# Patient Record
Sex: Female | Born: 1990 | Race: White | Hispanic: No | Marital: Single | Smoking: Never smoker
Health system: Southern US, Community
[De-identification: ages and names within clinical notes are randomized; demographics above are authoritative.]

## PROBLEM LIST (undated history)

## (undated) DIAGNOSIS — N200 Calculus of kidney: Secondary | ICD-10-CM

## (undated) HISTORY — PX: NECK SURGERY: SHX720

---

## 2011-03-25 ENCOUNTER — Emergency Department (HOSPITAL_COMMUNITY)
Admission: EM | Admit: 2011-03-25 | Discharge: 2011-03-25 | Disposition: A | Discharge status: Home / Self Care | Payer: PRIVATE HEALTH INSURANCE | Attending: Emergency Medicine | Admitting: Emergency Medicine

## 2011-03-25 ENCOUNTER — Encounter (HOSPITAL_COMMUNITY): Payer: Self-pay | Admitting: Radiology

## 2011-03-25 ENCOUNTER — Emergency Department (HOSPITAL_COMMUNITY): Payer: PRIVATE HEALTH INSURANCE

## 2011-03-25 DIAGNOSIS — N201 Calculus of ureter: Secondary | ICD-10-CM | POA: Insufficient documentation

## 2011-03-25 LAB — URINE MICROSCOPIC-ADD ON

## 2011-03-25 LAB — CBC
MCV: 89.3 fL (ref 78.0–100.0)
Platelets: 159 10*3/uL (ref 150–400)
RBC: 4.11 MIL/uL (ref 3.87–5.11)
WBC: 12.9 10*3/uL — ABNORMAL HIGH (ref 4.0–10.5)

## 2011-03-25 LAB — DIFFERENTIAL
Basophils Relative: 0 % (ref 0–1)
Eosinophils Absolute: 0.1 10*3/uL (ref 0.0–0.7)
Eosinophils Relative: 1 % (ref 0–5)
Lymphocytes Relative: 11 % — ABNORMAL LOW (ref 12–46)
Monocytes Relative: 3 % (ref 3–12)
Neutrophils Relative %: 85 % — ABNORMAL HIGH (ref 43–77)

## 2011-03-25 LAB — POCT I-STAT, CHEM 8
BUN: 14 mg/dL (ref 6–23)
Chloride: 107 mEq/L (ref 96–112)
Creatinine, Ser: 0.8 mg/dL (ref 0.4–1.2)
Hemoglobin: 13.3 g/dL (ref 12.0–15.0)
Potassium: 3.6 mEq/L (ref 3.5–5.1)
Sodium: 141 mEq/L (ref 135–145)

## 2011-03-25 LAB — URINALYSIS, ROUTINE W REFLEX MICROSCOPIC
Bilirubin Urine: NEGATIVE
Leukocytes, UA: NEGATIVE
Nitrite: NEGATIVE
Specific Gravity, Urine: 1.018 (ref 1.005–1.030)
pH: 8.5 — ABNORMAL HIGH (ref 5.0–8.0)

## 2011-03-25 MED ORDER — IOHEXOL 300 MG/ML  SOLN
100.0000 mL | Freq: Once | INTRAMUSCULAR | Status: AC | PRN
  Administered 2011-03-25: 80 mL via INTRAVENOUS

## 2013-04-17 ENCOUNTER — Emergency Department (HOSPITAL_COMMUNITY): Payer: BC Managed Care – PPO

## 2013-04-17 ENCOUNTER — Encounter (HOSPITAL_COMMUNITY): Payer: Self-pay

## 2013-04-17 ENCOUNTER — Emergency Department (HOSPITAL_COMMUNITY)
Admission: EM | Admit: 2013-04-17 | Discharge: 2013-04-17 | Disposition: A | Discharge status: Home / Self Care | Payer: BC Managed Care – PPO | Attending: Emergency Medicine | Admitting: Emergency Medicine

## 2013-04-17 DIAGNOSIS — Z79899 Other long term (current) drug therapy: Secondary | ICD-10-CM | POA: Insufficient documentation

## 2013-04-17 DIAGNOSIS — N83201 Unspecified ovarian cyst, right side: Secondary | ICD-10-CM

## 2013-04-17 DIAGNOSIS — R109 Unspecified abdominal pain: Secondary | ICD-10-CM | POA: Insufficient documentation

## 2013-04-17 DIAGNOSIS — Z3202 Encounter for pregnancy test, result negative: Secondary | ICD-10-CM | POA: Insufficient documentation

## 2013-04-17 DIAGNOSIS — N83209 Unspecified ovarian cyst, unspecified side: Secondary | ICD-10-CM | POA: Insufficient documentation

## 2013-04-17 DIAGNOSIS — N200 Calculus of kidney: Secondary | ICD-10-CM | POA: Insufficient documentation

## 2013-04-17 LAB — URINALYSIS, ROUTINE W REFLEX MICROSCOPIC
Bilirubin Urine: NEGATIVE
Ketones, ur: NEGATIVE mg/dL
Protein, ur: NEGATIVE mg/dL
Urobilinogen, UA: 1 mg/dL (ref 0.0–1.0)

## 2013-04-17 LAB — URINE MICROSCOPIC-ADD ON

## 2013-04-17 MED ORDER — OXYCODONE-ACETAMINOPHEN 5-325 MG PO TABS
1.0000 | ORAL_TABLET | ORAL | Status: DC | PRN
Start: 2013-04-17 — End: 2014-08-11

## 2013-04-17 MED ORDER — TAMSULOSIN HCL 0.4 MG PO CAPS: 0.4000 mg | ORAL_CAPSULE | Freq: Two times a day (BID) | ORAL | Status: DC

## 2013-04-17 MED ORDER — PROMETHAZINE HCL 25 MG PO TABS: 25.0000 mg | ORAL_TABLET | Freq: Four times a day (QID) | ORAL | Status: DC | PRN

## 2013-04-17 MED ORDER — NAPROXEN 500 MG PO TABS: 500.0000 mg | ORAL_TABLET | Freq: Two times a day (BID) | ORAL | Status: DC

## 2013-04-17 MED ORDER — NAPROXEN 250 MG PO TABS
500.0000 mg | ORAL_TABLET | Freq: Once | ORAL | Status: AC
  Administered 2013-04-17: 500 mg via ORAL
  Filled 2013-04-17: qty 2

## 2013-04-17 NOTE — ED Notes (Signed)
Pt states she has been having right side pain for the past few days and then this morning started having urinary urgency and frequency.

## 2013-04-17 NOTE — ED Provider Notes (Signed)
History    CSN: [id number] Arrival date & time 04/17/13  0445  First MD Initiated Contact with Patient 04/17/13 [phone]     Chief Complaint  Patient presents with  . Urinary Frequency   (Consider location/radiation/quality/duration/timing/severity/associated sxs/prior Treatment) HPI Comments: Pt is an otherwise healthy 22 y/o female who states that she has had a KS in the past - presents with complaint of lower abd pain that has been present for several days intermittently radiating to the R flank.  She has no fevers but had become diaphoretic earlier in the day with the pain.  This last 12 hours she has noticed that she has had difficulty with urinating - she has the urge but is unable to pass much urine on attempt.  Sx are intermittent, gradually worsening.  No n/v.  The history is provided by the patient and a relative.   History reviewed. No pertinent past medical history. History reviewed. No pertinent past surgical history. No family history on file. History  Substance Use Topics  . Smoking status: Never Smoker   . Smokeless tobacco: Not on file  . Alcohol Use: No   OB History   Grav Para Term Preterm Abortions TAB SAB Ect Mult Living                 Review of Systems  All other systems reviewed and are negative.    Allergies  Peanut-containing drug products  Home Medications   Current Outpatient Rx  Name  Route  Sig  Dispense  Refill  . loratadine (CLARITIN) 10 MG tablet   Oral   Take 10 mg by mouth daily.         . naproxen (NAPROSYN) 500 MG tablet   Oral   Take 1 tablet (500 mg total) by mouth 2 (two) times daily with a meal.   30 tablet   0   . oxyCODONE-acetaminophen (PERCOCET) 5-325 MG per tablet   Oral   Take 1 tablet by mouth every 4 (four) hours as needed for pain.   20 tablet   0   . promethazine (PHENERGAN) 25 MG tablet   Oral   Take 1 tablet (25 mg total) by mouth every 6 (six) hours as needed for nausea.   12 tablet   0   . tamsulosin  (FLOMAX) 0.4 MG CAPS   Oral   Take 1 capsule (0.4 mg total) by mouth 2 (two) times daily.   10 capsule   0    BP 115/54  Pulse 86  Temp(Src) 97.9 F (36.6 C) (Oral)  Resp 19  SpO2 99%  LMP 04/05/2013 Physical Exam  Nursing note and vitals reviewed. Constitutional: She appears well-developed and well-nourished. No distress.  HENT:  Head: Normocephalic and atraumatic.  Mouth/Throat: Oropharynx is clear and moist. No oropharyngeal exudate.  Eyes: Conjunctivae and EOM are normal. Pupils are equal, round, and reactive to light. Right eye exhibits no discharge. Left eye exhibits no discharge. No scleral icterus.  Neck: Normal range of motion. Neck supple. No JVD present. No thyromegaly present.  Cardiovascular: Normal rate, regular rhythm, normal heart sounds and intact distal pulses.  Exam reveals no gallop and no friction rub.   No murmur heard. Pulmonary/Chest: Effort normal and breath sounds normal. No respiratory distress. She has no wheezes. She has no rales.  Abdominal: Soft. Bowel sounds are normal. She exhibits no distension and no mass. There is no tenderness.  No abd ttp and no CVA ttp  Musculoskeletal: Normal range  of motion. She exhibits no edema and no tenderness.  Lymphadenopathy:    She has no cervical adenopathy.  Neurological: She is alert. Coordination normal.  Skin: Skin is warm and dry. No rash noted. No erythema.  Psychiatric: She has a normal mood and affect. Her behavior is normal.    ED Course  Procedures (including critical care time) Labs Reviewed  URINALYSIS, ROUTINE W REFLEX MICROSCOPIC - Abnormal; Notable for the following:    APPearance CLOUDY (*)    Hgb urine dipstick LARGE (*)    Leukocytes, UA SMALL (*)    All other components within normal limits  URINE MICROSCOPIC-ADD ON - Abnormal; Notable for the following:    Bacteria, UA FEW (*)    All other components within normal limits  URINE CULTURE  POCT PREGNANCY, URINE   Ct Abdomen Pelvis Wo  Contrast  04/17/2013   *RADIOLOGY REPORT*  Clinical Data: Right-sided abdominal pain, urinary urgency/frequency  CT ABDOMEN AND PELVIS WITHOUT CONTRAST  Technique:  Multidetector CT imaging of the abdomen and pelvis was performed following the standard protocol without intravenous contrast.  Comparison: 03/25/2011  Findings: Lung bases are clear.  Unenhanced liver, spleen, pancreas, and adrenal glands within normal limits.  Gallbladder is unremarkable.  No intrahepatic or extrahepatic ductal dilatation.  Punctate nonobstructing left upper pole renal calculus (series 2/image 18).  3 mm nonobstructing left lower pole renal calculus (series 2/image 24).  Right kidney is within normal limits.  No hydronephrosis.  No evidence of bowel obstruction.  Normal appendix.  No evidence of abdominal aortic aneurysm.  No abdominal ascites.  No suspicious abdominopelvic lymphadenopathy.  Uterus left ovary are unremarkable.  5.5 x 4.1 cm right ovarian cyst / follicle, likely physiologic.  Suspected 3 mm distal right ureteral calculus (series 2/image 66). Bladder is within normal limits.  Visualized osseous structures are within normal limits.  IMPRESSION: Suspected 3 mm distal right ureteral calculus.  No hydronephrosis.  Two nonobstructing left renal calculi measuring up to 3 mm.  5.5 cm right ovarian cyst / follicle, likely physiologic.  If clinically warranted, consider follow-up pelvic ultrasound in 6-12 weeks.   Original Report Authenticated By: Charline Bills, M.D.   1. Kidney stone on right side   2. Ovarian cyst, right     MDM  The pt has no abd ttp, soft abd, normal VS and no CVA ttp.  Check ua and preg.  Pt appears very comfortable on initial exam.  Naprosyn ordered.  Pt states that she feels stable - no acute pain at this time - CT confirms 3mm stone as well as ovarian cyst - pt informed - meds Rx for home, Uro f/u.  Meds given in ED:  Medications  naproxen (NAPROSYN) tablet 500 mg (500 mg Oral Given  04/17/13 0537)    New Prescriptions   NAPROXEN (NAPROSYN) 500 MG TABLET    Take 1 tablet (500 mg total) by mouth 2 (two) times daily with a meal.   OXYCODONE-ACETAMINOPHEN (PERCOCET) 5-325 MG PER TABLET    Take 1 tablet by mouth every 4 (four) hours as needed for pain.   PROMETHAZINE (PHENERGAN) 25 MG TABLET    Take 1 tablet (25 mg total) by mouth every 6 (six) hours as needed for nausea.   TAMSULOSIN (FLOMAX) 0.4 MG CAPS    Take 1 capsule (0.4 mg total) by mouth 2 (two) times daily.      Vida Roller, MD 04/17/13 [phone]

## 2013-04-18 ENCOUNTER — Encounter (HOSPITAL_COMMUNITY): Payer: Self-pay | Admitting: *Deleted

## 2013-04-18 ENCOUNTER — Emergency Department (HOSPITAL_COMMUNITY)
Admission: EM | Admit: 2013-04-18 | Discharge: 2013-04-18 | Disposition: A | Discharge status: Home / Self Care | Payer: BC Managed Care – PPO | Attending: Emergency Medicine | Admitting: Emergency Medicine

## 2013-04-18 DIAGNOSIS — N200 Calculus of kidney: Secondary | ICD-10-CM | POA: Insufficient documentation

## 2013-04-18 DIAGNOSIS — Z79899 Other long term (current) drug therapy: Secondary | ICD-10-CM | POA: Insufficient documentation

## 2013-04-18 DIAGNOSIS — R112 Nausea with vomiting, unspecified: Secondary | ICD-10-CM | POA: Insufficient documentation

## 2013-04-18 DIAGNOSIS — R1031 Right lower quadrant pain: Secondary | ICD-10-CM | POA: Insufficient documentation

## 2013-04-18 LAB — POCT I-STAT, CHEM 8
BUN: 14 mg/dL (ref 6–23)
Calcium, Ion: 1.12 mmol/L (ref 1.12–1.23)
Creatinine, Ser: 0.8 mg/dL (ref 0.50–1.10)
TCO2: 21 mmol/L (ref 0–100)

## 2013-04-18 MED ORDER — PROMETHAZINE HCL 25 MG RE SUPP: 25.0000 mg | Freq: Four times a day (QID) | RECTAL | Status: DC | PRN

## 2013-04-18 MED ORDER — ONDANSETRON 4 MG PO TBDP: 4.0000 mg | ORAL_TABLET | Freq: Three times a day (TID) | ORAL | Status: DC | PRN

## 2013-04-18 MED ORDER — ONDANSETRON HCL 4 MG/2ML IJ SOLN
4.0000 mg | Freq: Once | INTRAMUSCULAR | Status: AC
  Administered 2013-04-18: 4 mg via INTRAVENOUS
  Filled 2013-04-18: qty 2

## 2013-04-18 MED ORDER — ONDANSETRON HCL 4 MG/2ML IJ SOLN: 4.0000 mg | Freq: Once | INTRAMUSCULAR | Status: DC

## 2013-04-18 MED ORDER — HYDROMORPHONE HCL PF 1 MG/ML IJ SOLN
1.0000 mg | Freq: Once | INTRAMUSCULAR | Status: AC
  Administered 2013-04-18: 1 mg via INTRAVENOUS
  Filled 2013-04-18: qty 1

## 2013-04-18 MED ORDER — KETOROLAC TROMETHAMINE 30 MG/ML IJ SOLN
30.0000 mg | Freq: Once | INTRAMUSCULAR | Status: AC
  Administered 2013-04-18: 30 mg via INTRAVENOUS
  Filled 2013-04-18: qty 1

## 2013-04-18 NOTE — ED Provider Notes (Signed)
History    CSN: [id number] Arrival date & time 04/18/13  4098  First MD Initiated Contact with Patient 04/18/13 0246     Chief Complaint  Patient presents with  . Emesis  . Flank Pain   (Consider location/radiation/quality/duration/timing/severity/associated sxs/prior Treatment) HPI Comments: 22 year old female presents with second visit for kidney stone pain and nausea and vomiting in the last 24 hours. The patient had had a mild amount of pain yesterday, had a CT scan showing a distal ureteral kidney stone as well as an ovarian cyst, she was informed of these results, given prescription medications including Flomax, antiemetics and pain medications but throughout the course of the day have gradually developed worsening symptoms this evening. She has been nauseated, vomiting, unable to tolerate oral medications prior to arrival. The pain is in the right lower quadrant, radiates to the flank and is sharp and stabbing, intermittent and nothing seems to make it better or worse.  Patient is a 22 y.o. female presenting with vomiting and flank pain. The history is provided by the patient, a relative and medical records.  Emesis Flank Pain   History reviewed. No pertinent past medical history. History reviewed. No pertinent past surgical history. No family history on file. History  Substance Use Topics  . Smoking status: Never Smoker   . Smokeless tobacco: Not on file  . Alcohol Use: No   OB History   Grav Para Term Preterm Abortions TAB SAB Ect Mult Living                 Review of Systems  Gastrointestinal: Positive for vomiting.  Genitourinary: Positive for flank pain.  All other systems reviewed and are negative.    Allergies  Peanut-containing drug products  Home Medications   Current Outpatient Rx  Name  Route  Sig  Dispense  Refill  . loratadine (CLARITIN) 10 MG tablet   Oral   Take 10 mg by mouth daily.         . naproxen (NAPROSYN) 500 MG tablet   Oral    Take 1 tablet (500 mg total) by mouth 2 (two) times daily with a meal.   30 tablet   0   . oxyCODONE-acetaminophen (PERCOCET) 5-325 MG per tablet   Oral   Take 1 tablet by mouth every 4 (four) hours as needed for pain.   20 tablet   0   . promethazine (PHENERGAN) 25 MG tablet   Oral   Take 1 tablet (25 mg total) by mouth every 6 (six) hours as needed for nausea.   12 tablet   0   . tamsulosin (FLOMAX) 0.4 MG CAPS   Oral   Take 1 capsule (0.4 mg total) by mouth 2 (two) times daily.   10 capsule   0   . ondansetron (ZOFRAN ODT) 4 MG disintegrating tablet   Oral   Take 1 tablet (4 mg total) by mouth every 8 (eight) hours as needed for nausea.   10 tablet   0   . promethazine (PHENERGAN) 25 MG suppository   Rectal   Place 1 suppository (25 mg total) rectally every 6 (six) hours as needed for nausea.   12 each   0    BP 124/66  Temp(Src) 98.2 F (36.8 C) (Oral)  Resp 18  SpO2 98%  LMP 04/05/2013 Physical Exam  Nursing note and vitals reviewed. Constitutional: She appears well-developed and well-nourished.  Uncomfortable appearing, colicky  HENT:  Head: Normocephalic and atraumatic.  Mouth/Throat: Oropharynx  is clear and moist. No oropharyngeal exudate.  Eyes: Conjunctivae and EOM are normal. Pupils are equal, round, and reactive to light. Right eye exhibits no discharge. Left eye exhibits no discharge. No scleral icterus.  Neck: Normal range of motion. Neck supple. No JVD present. No thyromegaly present.  Cardiovascular: Normal rate, regular rhythm, normal heart sounds and intact distal pulses.  Exam reveals no gallop and no friction rub.   No murmur heard. Pulmonary/Chest: Effort normal and breath sounds normal. No respiratory distress. She has no wheezes. She has no rales.  Abdominal: Soft. Bowel sounds are normal. She exhibits no distension and no mass. There is no tenderness.  The patient has no reproducible abdominal tenderness to palpation  Musculoskeletal:  Normal range of motion. She exhibits no edema and no tenderness.  Lymphadenopathy:    She has no cervical adenopathy.  Neurological: She is alert. Coordination normal.  Skin: Skin is warm and dry. No rash noted. No erythema.  Psychiatric: She has a normal mood and affect. Her behavior is normal.    ED Course  Procedures (including critical care time) Labs Reviewed  POCT I-STAT, CHEM 8 - Abnormal; Notable for the following:    Glucose, Bld 131 (*)    All other components within normal limits   Ct Abdomen Pelvis Wo Contrast  04/17/2013   *RADIOLOGY REPORT*  Clinical Data: Right-sided abdominal pain, urinary urgency/frequency  CT ABDOMEN AND PELVIS WITHOUT CONTRAST  Technique:  Multidetector CT imaging of the abdomen and pelvis was performed following the standard protocol without intravenous contrast.  Comparison: 03/25/2011  Findings: Lung bases are clear.  Unenhanced liver, spleen, pancreas, and adrenal glands within normal limits.  Gallbladder is unremarkable.  No intrahepatic or extrahepatic ductal dilatation.  Punctate nonobstructing left upper pole renal calculus (series 2/image 18).  3 mm nonobstructing left lower pole renal calculus (series 2/image 24).  Right kidney is within normal limits.  No hydronephrosis.  No evidence of bowel obstruction.  Normal appendix.  No evidence of abdominal aortic aneurysm.  No abdominal ascites.  No suspicious abdominopelvic lymphadenopathy.  Uterus left ovary are unremarkable.  5.5 x 4.1 cm right ovarian cyst / follicle, likely physiologic.  Suspected 3 mm distal right ureteral calculus (series 2/image 66). Bladder is within normal limits.  Visualized osseous structures are within normal limits.  IMPRESSION: Suspected 3 mm distal right ureteral calculus.  No hydronephrosis.  Two nonobstructing left renal calculi measuring up to 3 mm.  5.5 cm right ovarian cyst / follicle, likely physiologic.  If clinically warranted, consider follow-up pelvic ultrasound in 6-12  weeks.   Original Report Authenticated By: Charline Bills, M.D.   1. Kidney stone on right side   2. Nausea and vomiting     MDM  The patient has ongoing right lower quadrant and right-sided pain likely secondary to her kidney stone. She had hematuria yesterday, i-STAT chemistry today shows that she has no renal dysfunction of any concern, he medications have been given, she declines IV fluids and requests oral fluid rehydration. Medications have improved her symptoms significantly and she is now stable for discharge   Meds given in ED:  Medications  ketorolac (TORADOL) 30 MG/ML injection 30 mg (30 mg Intravenous Given 04/18/13 0331)  HYDROmorphone (DILAUDID) injection 1 mg (1 mg Intravenous Given 04/18/13 0330)  ondansetron (ZOFRAN) injection 4 mg (4 mg Intravenous Given 04/18/13 0331)    New Prescriptions   ONDANSETRON (ZOFRAN ODT) 4 MG DISINTEGRATING TABLET    Take 1 tablet (4 mg total)  by mouth every 8 (eight) hours as needed for nausea.   PROMETHAZINE (PHENERGAN) 25 MG SUPPOSITORY    Place 1 suppository (25 mg total) rectally every 6 (six) hours as needed for nausea.      Vida Roller, MD 04/18/13 [phone]

## 2013-04-18 NOTE — ED Notes (Signed)
No new changes from previous assessment.

## 2013-04-18 NOTE — ED Notes (Addendum)
Pt returns for nv, unable to keep meds down, was d/c'd from here 7/4 0830 after being seen for kidney stones & ovarian cysts, pain and nv worse. Here with mother. Actively vomiting.

## 2013-04-19 LAB — URINE CULTURE

## 2014-08-11 ENCOUNTER — Emergency Department (HOSPITAL_COMMUNITY): Payer: PRIVATE HEALTH INSURANCE

## 2014-08-11 ENCOUNTER — Encounter (HOSPITAL_COMMUNITY): Payer: Self-pay | Admitting: Emergency Medicine

## 2014-08-11 ENCOUNTER — Emergency Department (HOSPITAL_COMMUNITY)
Admission: EM | Admit: 2014-08-11 | Discharge: 2014-08-11 | Disposition: A | Discharge status: Home / Self Care | Payer: PRIVATE HEALTH INSURANCE | Attending: Emergency Medicine | Admitting: Emergency Medicine

## 2014-08-11 DIAGNOSIS — Z3202 Encounter for pregnancy test, result negative: Secondary | ICD-10-CM | POA: Insufficient documentation

## 2014-08-11 DIAGNOSIS — R109 Unspecified abdominal pain: Secondary | ICD-10-CM

## 2014-08-11 DIAGNOSIS — N200 Calculus of kidney: Secondary | ICD-10-CM

## 2014-08-11 DIAGNOSIS — Z79899 Other long term (current) drug therapy: Secondary | ICD-10-CM | POA: Insufficient documentation

## 2014-08-11 LAB — URINE MICROSCOPIC-ADD ON

## 2014-08-11 LAB — COMPREHENSIVE METABOLIC PANEL
ALT: 12 U/L (ref 0–35)
AST: 16 U/L (ref 0–37)
Albumin: 4.1 g/dL (ref 3.5–5.2)
Alkaline Phosphatase: 34 U/L — ABNORMAL LOW (ref 39–117)
Anion gap: 12 (ref 5–15)
BUN: 15 mg/dL (ref 6–23)
CO2: 24 mEq/L (ref 19–32)
Calcium: 9.4 mg/dL (ref 8.4–10.5)
Chloride: 104 mEq/L (ref 96–112)
Creatinine, Ser: 0.55 mg/dL (ref 0.50–1.10)
Glucose, Bld: 109 mg/dL — ABNORMAL HIGH (ref 70–99)
POTASSIUM: 3.9 meq/L (ref 3.7–5.3)
SODIUM: 140 meq/L (ref 137–147)
Total Bilirubin: 0.3 mg/dL (ref 0.3–1.2)
Total Protein: 7.4 g/dL (ref 6.0–8.3)

## 2014-08-11 LAB — URINALYSIS, ROUTINE W REFLEX MICROSCOPIC
Bilirubin Urine: NEGATIVE
GLUCOSE, UA: NEGATIVE mg/dL
KETONES UR: NEGATIVE mg/dL
Leukocytes, UA: NEGATIVE
Nitrite: NEGATIVE
PROTEIN: NEGATIVE mg/dL
Specific Gravity, Urine: 1.02 (ref 1.005–1.030)
Urobilinogen, UA: 1 mg/dL (ref 0.0–1.0)
pH: 7 (ref 5.0–8.0)

## 2014-08-11 LAB — CBC WITH DIFFERENTIAL/PLATELET
Basophils Absolute: 0 10*3/uL (ref 0.0–0.1)
Basophils Relative: 0 % (ref 0–1)
Eosinophils Absolute: 0.2 10*3/uL (ref 0.0–0.7)
Eosinophils Relative: 2 % (ref 0–5)
HCT: 38.3 % (ref 36.0–46.0)
Hemoglobin: 13.4 g/dL (ref 12.0–15.0)
LYMPHS PCT: 15 % (ref 12–46)
Lymphs Abs: 1.7 10*3/uL (ref 0.7–4.0)
MCH: 32.1 pg (ref 26.0–34.0)
MCHC: 35 g/dL (ref 30.0–36.0)
MCV: 91.8 fL (ref 78.0–100.0)
Monocytes Absolute: 0.8 10*3/uL (ref 0.1–1.0)
Monocytes Relative: 7 % (ref 3–12)
Neutro Abs: 9.2 10*3/uL — ABNORMAL HIGH (ref 1.7–7.7)
Neutrophils Relative %: 76 % (ref 43–77)
PLATELETS: 212 10*3/uL (ref 150–400)
RBC: 4.17 MIL/uL (ref 3.87–5.11)
RDW: 11.5 % (ref 11.5–15.5)
WBC: 11.9 10*3/uL — AB (ref 4.0–10.5)

## 2014-08-11 LAB — POC URINE PREG, ED: Preg Test, Ur: NEGATIVE

## 2014-08-11 MED ORDER — MORPHINE SULFATE 4 MG/ML IJ SOLN
4.0000 mg | Freq: Once | INTRAMUSCULAR | Status: AC
  Administered 2014-08-11: 4 mg via INTRAVENOUS
  Filled 2014-08-11: qty 1

## 2014-08-11 MED ORDER — IBUPROFEN 600 MG PO TABS: 600.0000 mg | ORAL_TABLET | Freq: Four times a day (QID) | ORAL | Status: AC | PRN

## 2014-08-11 MED ORDER — ONDANSETRON 4 MG PO TBDP
8.0000 mg | ORAL_TABLET | Freq: Once | ORAL | Status: AC
  Administered 2014-08-11: 8 mg via ORAL
  Filled 2014-08-11: qty 2

## 2014-08-11 MED ORDER — ONDANSETRON HCL 4 MG/2ML IJ SOLN
4.0000 mg | Freq: Once | INTRAMUSCULAR | Status: AC
  Administered 2014-08-11: 4 mg via INTRAVENOUS
  Filled 2014-08-11: qty 2

## 2014-08-11 MED ORDER — FENTANYL CITRATE 0.05 MG/ML IJ SOLN
50.0000 ug | Freq: Once | INTRAMUSCULAR | Status: AC
  Administered 2014-08-11: 50 ug via INTRAVENOUS
  Filled 2014-08-11: qty 2

## 2014-08-11 MED ORDER — ONDANSETRON 4 MG PO TBDP: 4.0000 mg | ORAL_TABLET | ORAL | Status: AC | PRN

## 2014-08-11 NOTE — ED Provider Notes (Signed)
CSN: [id number]     Arrival date & time 08/11/14  1636 History   First MD Initiated Contact with Patient 08/11/14 1735     Chief Complaint  Patient presents with  . Flank Pain     (Consider location/radiation/quality/duration/timing/severity/associated sxs/prior Treatment) HPI The patient reports that she had had some sense of urinary pressure over the past day. However about an hour prior to arrival she developed an intense pain in her left flank radiating down to her groin area. She reports she became nauseated and vomited. The patient does have a prior history of kidney stones. The patient reports that for a period of time she was seeing a urologist on a monthly basis for a sense of urinary urgency and a history of kidney stones. She reports however after a period of time she stopped going because the medications prescribed never helped much and it was expensive.  History reviewed. No pertinent past medical history. History reviewed. No pertinent past surgical history. No family history on file. History  Substance Use Topics  . Smoking status: Never Smoker   . Smokeless tobacco: Not on file  . Alcohol Use: No   OB History   Grav Para Term Preterm Abortions TAB SAB Ect Mult Living                 Review of Systems 10 Systems reviewed and are negative for acute change except as noted in the HPI.    Allergies  Peanut-containing drug products  Home Medications   Prior to Admission medications   Medication Sig Start Date End Date Taking? Authorizing Provider  ibuprofen (ADVIL,MOTRIN) 200 MG tablet Take 200 mg by mouth every 6 (six) hours as needed for moderate pain.   Yes Historical Provider, MD  Multiple Vitamin (MULTIVITAMIN WITH MINERALS) TABS tablet Take 1 tablet by mouth daily.   Yes Historical Provider, MD  ibuprofen (ADVIL,MOTRIN) 600 MG tablet Take 1 tablet (600 mg total) by mouth every 6 (six) hours as needed. 08/11/14   Arby BarretteMarcy Maydelin Deming, MD  ondansetron (ZOFRAN ODT) 4  MG disintegrating tablet Take 1 tablet (4 mg total) by mouth every 4 (four) hours as needed for nausea or vomiting. 08/11/14   Arby BarretteMarcy Damian Hofstra, MD   BP 103/55  Pulse 73  Temp(Src) 98.1 F (36.7 C) (Oral)  Resp 16  SpO2 98%  LMP 07/28/2014 Physical Exam  Constitutional: She is oriented to person, place, and time. She appears well-developed and well-nourished.  HENT:  Head: Normocephalic and atraumatic.  Eyes: EOM are normal. Pupils are equal, round, and reactive to light.  Neck: Neck supple.  Cardiovascular: Normal rate, regular rhythm, normal heart sounds and intact distal pulses.   Pulmonary/Chest: Effort normal and breath sounds normal.  Abdominal: Soft. Bowel sounds are normal. She exhibits no distension. There is no tenderness.  Musculoskeletal: Normal range of motion. She exhibits no edema.  Neurological: She is alert and oriented to person, place, and time. She has normal strength. Coordination normal. GCS eye subscore is 4. GCS verbal subscore is 5. GCS motor subscore is 6.  Skin: Skin is warm, dry and intact.  Psychiatric: She has a normal mood and affect.    ED Course  Procedures (including critical care time) Labs Review Labs Reviewed  CBC WITH DIFFERENTIAL - Abnormal; Notable for the following:    WBC 11.9 (*)    Neutro Abs 9.2 (*)    All other components within normal limits  COMPREHENSIVE METABOLIC PANEL - Abnormal; Notable for the following:  Glucose, Bld 109 (*)    Alkaline Phosphatase 34 (*)    All other components within normal limits  URINALYSIS, ROUTINE W REFLEX MICROSCOPIC - Abnormal; Notable for the following:    APPearance CLOUDY (*)    Hgb urine dipstick SMALL (*)    All other components within normal limits  URINE MICROSCOPIC-ADD ON - Abnormal; Notable for the following:    Squamous Epithelial / LPF FEW (*)    Bacteria, UA FEW (*)    All other components within normal limits  POC URINE PREG, ED    Imaging Review Ct Renal Stone  Study  08/11/2014   CLINICAL DATA:  Left flank pain. History of kidney stones. Initial encounter.  EXAM: CT ABDOMEN AND PELVIS WITHOUT CONTRAST  TECHNIQUE: Multidetector CT imaging of the abdomen and pelvis was performed following the standard protocol without IV contrast.  COMPARISON:  04/17/2013  FINDINGS: BODY WALL: Unremarkable.  LOWER CHEST: Unremarkable.  ABDOMEN/PELVIS:  Liver: No focal abnormality.  Biliary: No evidence of biliary obstruction or stone.  Pancreas: Unremarkable.  Spleen: Unremarkable.  Adrenals: Unremarkable.  Kidneys and ureters: 3 mm stone layering within the right urinary bladder. Subtle haziness around the upper left ureter and clinical history suggests recent passage from the left. There is bilateral nephrolithiasis with 2 punctate calculi on the right and a 2 mm stone in the interpolar left kidney.  Bladder: Stone as above.  No wall thickening.  Reproductive: Unremarkable.  Bowel: No obstruction.  Retroperitoneum: No mass or adenopathy.  Peritoneum: Small volume free pelvic fluid, usually physiologic.  Vascular: No acute abnormality.  OSSEOUS: Central disc herniation L5-S1, chronic and with buttressing osteophyte.  IMPRESSION: 1. 3 mm bladder calculus, likely recently passed from the left. 2. Bilateral nephrolithiasis.   Electronically Signed   By: Tiburcio PeaJonathan  Watts M.D.   On: 08/11/2014 19:55     EKG Interpretation None      MDM   Final diagnoses:  Flank pain  Kidney stone   At this time CT scan is consistent with a recently passed stone. It is noted within the bladder with a small amount of inflammatory change around the left ureter. The patient's symptoms are consistent with this. At this time at recheck she is well in appearance. There has been no vomiting. She is safe for continued outpatient management with recommendation for ibuprofen and Zofran if needed.    Arby BarretteMarcy Cala Kruckenberg, MD 08/11/14 2234

## 2014-08-11 NOTE — ED Notes (Signed)
Pt sts 1 hour ago she woke up with burning/ cramping pain radiating into L abdomen with nv, diaphoresis. Pt sts before she laid down she had urinary urgency. Hx kidney stones.

## 2014-08-11 NOTE — Discharge Instructions (Signed)
Kidney Stones °Kidney stones (urolithiasis) are deposits that form inside your kidneys. The intense pain is caused by the stone moving through the urinary tract. When the stone moves, the ureter goes into spasm around the stone. The stone is usually passed in the urine.  °CAUSES  °· A disorder that makes certain neck glands produce too much parathyroid hormone (primary hyperparathyroidism). °· A buildup of uric acid crystals, similar to gout in your joints. °· Narrowing (stricture) of the ureter. °· A kidney obstruction present at birth (congenital obstruction). °· Previous surgery on the kidney or ureters. °· Numerous kidney infections. °SYMPTOMS  °· Feeling sick to your stomach (nauseous). °· Throwing up (vomiting). °· Blood in the urine (hematuria). °· Pain that usually spreads (radiates) to the groin. °· Frequency or urgency of urination. °DIAGNOSIS  °· Taking a history and physical exam. °· Blood or urine tests. °· CT scan. °· Occasionally, an examination of the inside of the urinary bladder (cystoscopy) is performed. °TREATMENT  °· Observation. °· Increasing your fluid intake. °· Extracorporeal shock wave lithotripsy--This is a noninvasive procedure that uses shock waves to break up kidney stones. °· Surgery may be needed if you have severe pain or persistent obstruction. There are various surgical procedures. Most of the procedures are performed with the use of small instruments. Only small incisions are needed to accommodate these instruments, so recovery time is minimized. °The size, location, and chemical composition are all important variables that will determine the proper choice of action for you. Talk to your health care provider to better understand your situation so that you will minimize the risk of injury to yourself and your kidney.  °HOME CARE INSTRUCTIONS  °· Drink enough water and fluids to keep your urine clear or pale yellow. This will help you to pass the stone or stone fragments. °· Strain  all urine through the provided strainer. Keep all particulate matter and stones for your health care provider to see. The stone causing the pain may be as small as a grain of salt. It is very important to use the strainer each and every time you pass your urine. The collection of your stone will allow your health care provider to analyze it and verify that a stone has actually passed. The stone analysis will often identify what you can do to reduce the incidence of recurrences. °· Only take over-the-counter or prescription medicines for pain, discomfort, or fever as directed by your health care provider. °· Make a follow-up appointment with your health care provider as directed. °· Get follow-up X-rays if required. The absence of pain does not always mean that the stone has passed. It may have only stopped moving. If the urine remains completely obstructed, it can cause loss of kidney function or even complete destruction of the kidney. It is your responsibility to make sure X-rays and follow-ups are completed. Ultrasounds of the kidney can show blockages and the status of the kidney. Ultrasounds are not associated with any radiation and can be performed easily in a matter of minutes. °SEEK MEDICAL CARE IF: °· You experience pain that is progressive and unresponsive to any pain medicine you have been prescribed. °SEEK IMMEDIATE MEDICAL CARE IF:  °· Pain cannot be controlled with the prescribed medicine. °· You have a fever or shaking chills. °· The severity or intensity of pain increases over 18 hours and is not relieved by pain medicine. °· You develop a new onset of abdominal pain. °· You feel faint or pass out. °·   You are unable to urinate. °MAKE SURE YOU:  °· Understand these instructions. °· Will watch your condition. °· Will get help right away if you are not doing well or get worse. °Document Released: 10/01/2005 Document Revised: 06/03/2013 Document Reviewed: 03/04/2013 °ExitCare® Patient Information ©2015  [employer]. This information is not intended to replace advice given to you by your health care provider. Make sure you discuss any questions you have with your health care provider. ° ° ° °Emergency Department Resource Guide °1) Find a Doctor and Pay Out of Pocket °Although you won't have to find out who is covered by your insurance plan, it is a good idea to ask around and get recommendations. You will then need to call the office and see if the doctor you have chosen will accept you as a new patient and what types of options they offer for patients who are self-pay. Some doctors offer discounts or will set up payment plans for their patients who do not have insurance, but you will need to ask so you aren't surprised when you get to your appointment. ° °2) Contact Your Local Health Department °Not all health departments have doctors that can see patients for sick visits, but many do, so it is worth a call to see if yours does. If you don't know where your local health department is, you can check in your phone book. The CDC also has a tool to help you locate your state's health department, and many state websites also have listings of all of their local health departments. ° °3) Find a Walk-in Clinic °If your illness is not likely to be very severe or complicated, you may want to try a walk in clinic. These are popping up all over the country in pharmacies, drugstores, and shopping centers. They're usually staffed by nurse practitioners or physician assistants that have been trained to treat common illnesses and complaints. They're usually fairly quick and inexpensive. However, if you have serious medical issues or chronic medical problems, these are probably not your best option. ° °No Primary Care Doctor: °- Call [facility] at  832-8000 - they can help you locate a primary care doctor that  accepts your insurance, provides certain services, etc. °- Physician Referral Service- 1-800-533-3463 ° °Chronic  Pain Problems: °Organization         Address  Phone   Notes  °[facility]  (336) 297-2271 Patients need to be referred by their primary care doctor.  ° °Medication Assistance: °Organization         Address  Phone   Notes  °[place] [facility] [address] °[place], [place] [address] (336) 641-8030 --Must be a resident of [place] °-- Must have NO insurance coverage whatsoever (no Medicaid/ Medicare, etc.) °-- The pt. MUST have a primary care doctor that directs their care regularly and follows them in the community °  °[facility]  (866) 331-1348   °[employer]  (888) 892-1162   ° °Agencies that provide inexpensive medical care: °Organization         Address  Phone   Notes  °[facility]  (336) 832-8035   ° Internal Medicine    (336) 832-7272   °[facility] [address] °[place], [place] [address] (336) 832-4777   °[facility] [place] [address], °[place] (336) 271-4999   °[facility]    (336) 373-0678   °[facility]    (  336) 272-1050   °[facility] ° [address], [place] Phone:  (336) 832-4444, Fax:  (336) 832-4440 Hours of Operation:  9 am - 6 pm, M-F.  Also accepts Medicaid/Medicare and self-pay.  °[facility] ° [address], [place] Phone: (336) 832-3150, Fax: (336) 832-3151. Hours of Operation:  8:30 am - 5:30 pm, M-F.  Also accepts Medicaid and self-pay.  °[facility] [place] [address], [place] Phone: (336) 878-6027   °[facility] [address], [place], [place] (336)723-1848, Ext. 123 Mondays & Thursdays: 7-9 AM.  First 15 patients are seen on a first come, first serve basis. °  ° °Medicaid-accepting [place] Providers: ° °Organization         Address  Phone   Notes  °[facility] [address], [place] (336) 641-2100 Also  accepts self-pay patients.  °[facility] [address], [place] ° (336) 856-9996   °[facility] [address], [place] (336) 288-8857   °[facility] [address] [place] [address], [place] (336) 299-7000   °Veita Bland [address], [place]  ° (336) 373-1557 Only accepts [place] Access Medicaid patients after they have their name applied to their card.  ° °Self-Pay (no insurance) in [place]: ° °Organization         Address  Phone   Notes  °Sickle Cell Patients, [facility] [address], [place] (336) 832-1970   °[facility] [address], [place] (336) 832-4400   °[facility] ° [address], [place] (336) 992-4800   °[facility]/Dr. Osei-Bonsu ° [address] [place] [address], [place] or [address], [place] (336) 841-8500 Phone number for both [place] and [place] locations is the same.  °[facility] [address], [place] (336) 299-0000   °[facility] [address] [place] [address], [place] or [address] (336) 852-7530 °(336) 878-2260   °[facility] [address],  (336) 350-1642, phone; (336) 294-5005, fax Sees patients 1st and 3rd Saturday of every month.  Must not qualify for public or private insurance (i.e. Medicaid, Medicare, [place] Health Choice, Veterans' Benefits) • Household income should be no more than 200% of the poverty level •The clinic cannot treat you if you are pregnant or think you are pregnant • Sexually transmitted diseases are not treated at the clinic.  ° ° °Dental Care: °Organization         Address  Phone  Notes  °[facility] [facility] [address],  (336) 641-6152 Accepts children up to age 21 who are enrolled in Medicaid or [place] Health Choice; pregnant  women with a Medicaid card; and children who have applied for Medicaid or [place] Health Choice, but were declined, whose parents can pay a reduced fee at time of service.  °[facility] [place]  [address], [place] (336) 641-7733 Accepts children up to age 21 who are enrolled in Medicaid or [place] Health Choice; pregnant women with a Medicaid card; and children who have applied for Medicaid or [place] Health Choice, but were declined, whose parents can pay a reduced fee at time of service.  °[facility] ° [address], [place] (336) 641-4533 Patients are seen by appointment only. Walk-ins are not accepted. [facility] will see patients 18 years of age and older. °Monday - Tuesday (8am-5pm) °Most Wednesdays (8:30-5pm) °$30 per visit, cash only  °[facility] ° [address], [place] (336) 641-4533 Patients are seen by appointment only. Walk-ins are not accepted. [facility] will see patients 18 years of age and older. °One Wednesday Evening (Monthly: Volunteer Based).  $30 per visit, cash only  °[facility]  (919) 537-3737 for adults; Children under age 4, call Graduate Pediatric Dentistry at ([phone]) [phone]. Children aged 4-14, please call (919) [phone] to request a pediatric application. ° Dental services are provided in all areas of dental care including fillings, crowns and bridges, complete and partial dentures, implants, gum treatment, root canals, and extractions. Preventive care is also provided. Treatment is provided to both adults and children. °Patients are selected via a lottery and there is often a waiting list. °  °[facility] [address], °[place] ° (336) 763-8833 [email or web address] °  °[facility] [address], [place], [place] (336)723-1848, Ext. 123 Second and Fourth Thursday of each month, opens at 6:30 AM; Clinic ends at 9 AM.  Patients are  seen on a first-come first-served basis, and a limited number are seen during each clinic.  ° °[facility] ° [address], [place], [place] (336) 723-7904   Eligibility Requirements °You must have lived in [place], [place], or [place] counties for at least the last three months. °  You cannot be eligible for state or federal sponsored healthcare insurance, including Veterans Administration, Medicaid, or Medicare. °  You generally cannot be eligible for healthcare insurance through your employer.  °  How to apply: °Eligibility screenings are held every Tuesday and Wednesday afternoon from 1:00 pm until 4:00 pm. You do not need an appointment for the interview!  °[facility] [address], [place], [place] 336-631-2330   °[facility]  336-342-8273   °[facility]  336-703-3100   °[facility]  336-570-6415   ° °Behavioral Health Resources in the Community: °Intensive Outpatient Programs °Organization         Address  Phone  Notes  °[facility] [address], [place], [place] 336-878-6098   °[facility] [address], [place], [place] 336-832-9800   °[facility] [address], [place], [place] ° 336-882-2125   °[facility] [address],  °[place], [place] 1-800-853-5163 or 336-641-4981   °Substance Abuse Resources °Organization         Address  Phone  Notes  °[facility]  336-882-2125   °[facility]  336-784-9470   °[facility]  336-285-9073   °[facility]  336-845-3988   °Residential & Outpatient Substance Abuse Program  1-800-659-3381   °Psychological Services °Organization         Address  Phone  Notes  °[facility]  336- 832-9600   °[facility]  336- 378-7881   °[facility] [address], [place] 1-800-853-5163 or 336-641-4981   ° °Mobile Crisis  Teams °Organization         Address  Phone  Notes  °[facility], Mobile Crisis Care Unit  1-877-626-1772   °Assertive °[facility] ° [address] [place],   [place] 336-834-9664   °Sharon DeEsch [address] °[place] 336-554-5454   ° °Self-Help/Support Groups °Organization         Address  Phone             Notes  °[facility] [place] - variety of support groups  336- 373-1402 Call for more information  °Narcotics Anonymous (NA), [facility] [address], °[place]  2 meetings at this location  ° °Residential Treatment Programs °Organization         Address  Phone  Notes  °[facility] [address],    °[place]  1-866-801-8205   °[facility] ° [address], [place], [place] 704-293-8524   °[facility] [address], [place] 336-845-3988 Admissions: 8am-3pm M-F  °[facility] [address]    °[place], [place] 336-841-1104   °[facility] [address], [place], [place] 336-379-7146   °[facility] [address] [place] [address]  °[place], [place] 336-285-9073   °[facility] - Intensive Outpatient [address], [place], [place] 336-852-3033   °[facility] [address]  °[place], [place] 1-877-615-2722 or 336-784-9470   °[facility] [address] [place], [place] 336-227-7417 Accepts Medicaid  °[facility] [address]  °[place] 1-800-659-3381 Substance Abuse/Addiction Treatment  ° °[place] Behavioral Health Resources °Organization         Address  Phone  Notes  °[facility]  (888) 581-9988   °Julie Brannon, PhD [address] [place], [place]   (336) 349-5553 or (336) 951-0000   °[facility]   [address] °[place], [place] (336) 349-4454   °[facility] [address], [place], [place] (336) 342-8316  Insurance/Medicaid/sponsorship through [facility]  °[facility] [address]                                    [place], [place] (336) 342-8316 Therapy/tele-psych/case  °[facility] [address]  ° [place],  (336) 349-2233    °Dr. Arfeen  (336) 349-4544   °[facility] [place]  [employer] [place] [facility] 1) [address], [place] °2) [address], [place] °3)  [address], [place] (336) 349-3220 °(336) 342-7768 ° °(336) 342-8140   °[place] [facility] (336) 342-1394 or (336) 342-3537 (After Hours)    ° ° ° °

## 2014-08-11 NOTE — ED Notes (Signed)
EDP at bedside  

## 2014-08-11 NOTE — ED Notes (Signed)
Patient transported to CT 

## 2015-10-02 IMAGING — CT CT RENAL STONE PROTOCOL
2 of 4 series · 17 of 46 positions shown, 19 images · non-contrast
Comparison: 04/17/2013

CLINICAL DATA: Left flank pain. History of kidney stones. Initial
encounter.

EXAM:
CT ABDOMEN AND PELVIS WITHOUT CONTRAST
TECHNIQUE: Multidetector CT imaging of the abdomen and pelvis was performed
following the standard protocol without IV contrast.

[Series 2: stone study 5.0 i30f 1 · axial · 0.79mm/px · z∈[-912,-552]mm · 14 of 80 slices shown, 16 images]
[im 4/80  soft-tissue]
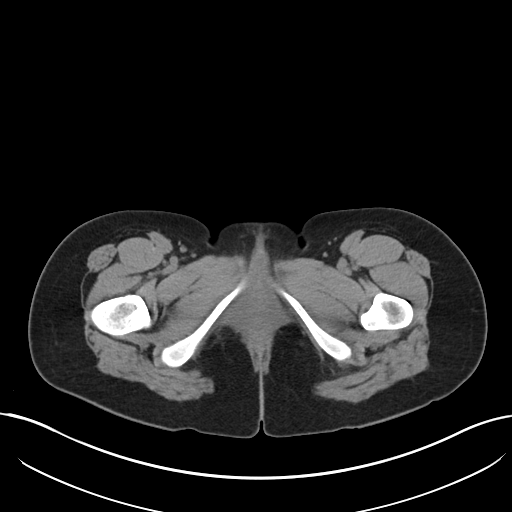
[im 4/80  bone]
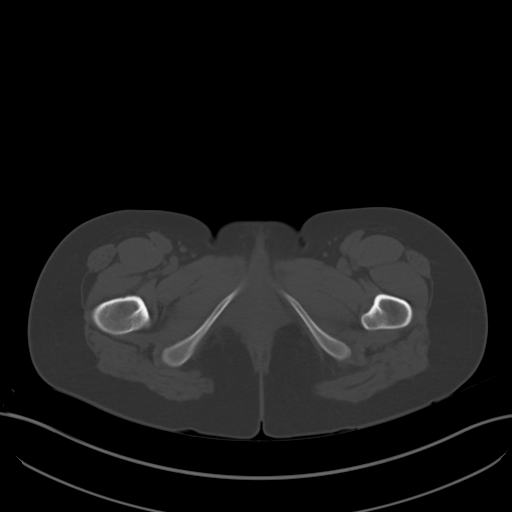
[im 10/80  soft-tissue]
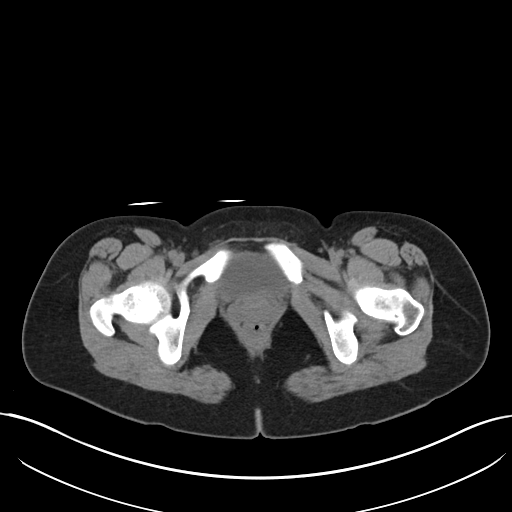
[im 17/80  soft-tissue]
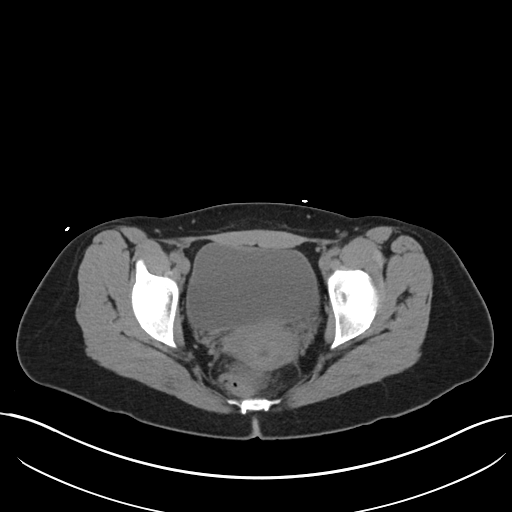
[im 20/80  soft-tissue]
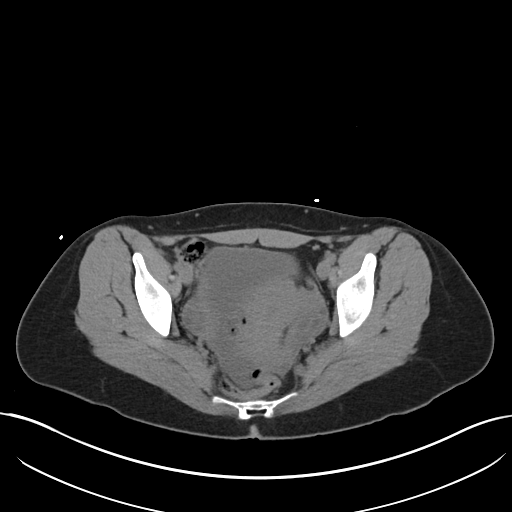
[im 27/80  soft-tissue]
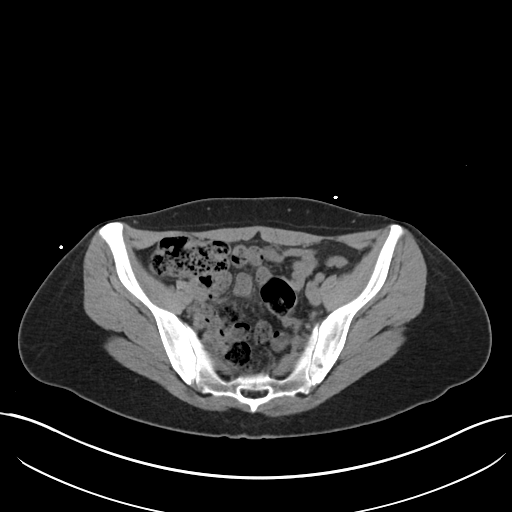
[im 33/80  soft-tissue]
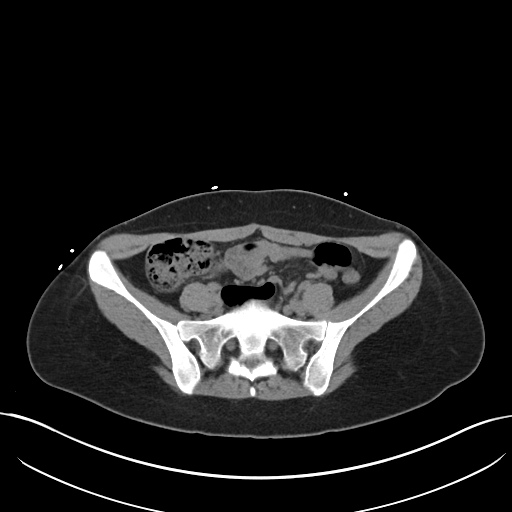
[im 37/80  soft-tissue]
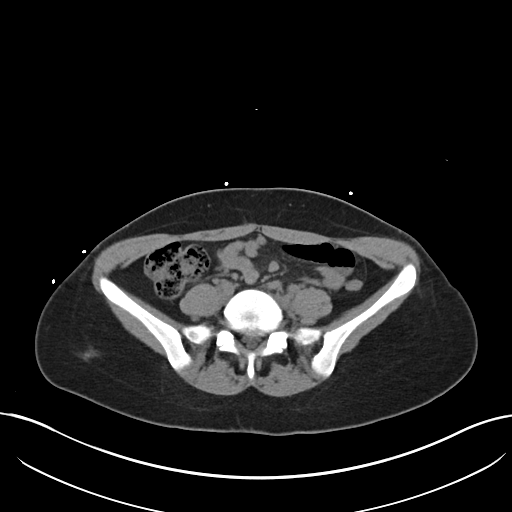
[im 43/80  soft-tissue]
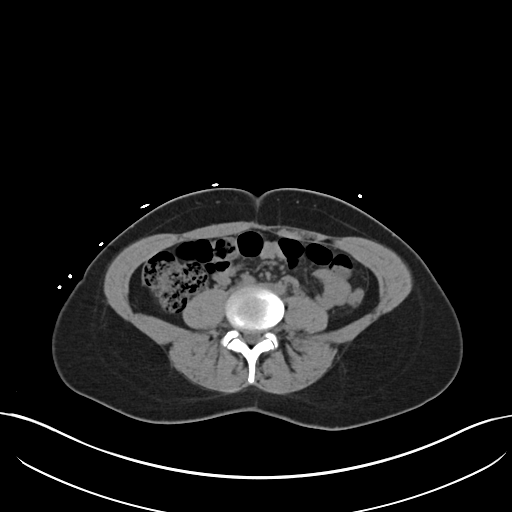
[im 47/80  soft-tissue]
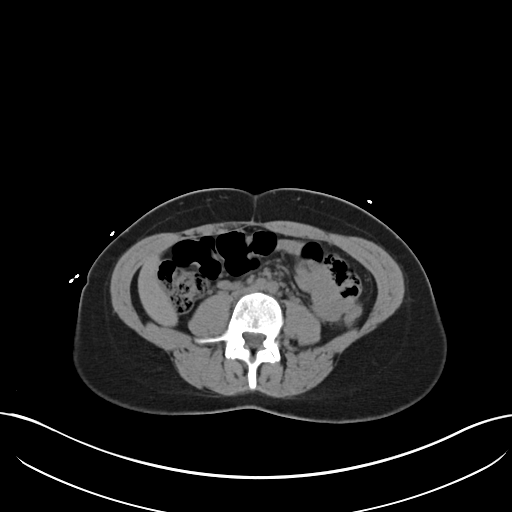
[im 47/80  bone]
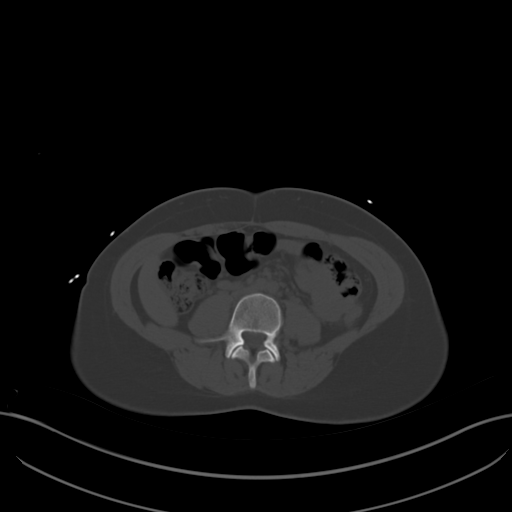
[im 53/80  soft-tissue]
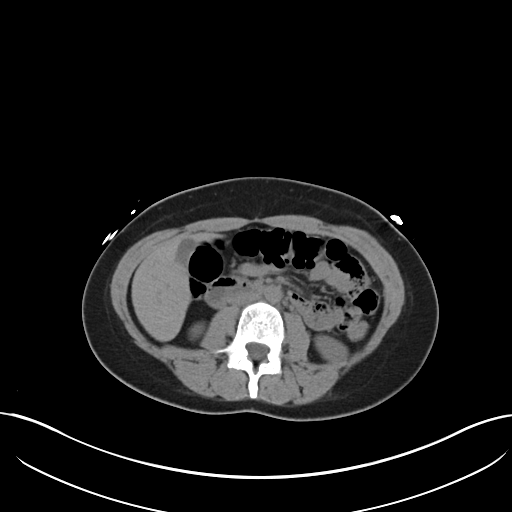
[im 60/80  soft-tissue]
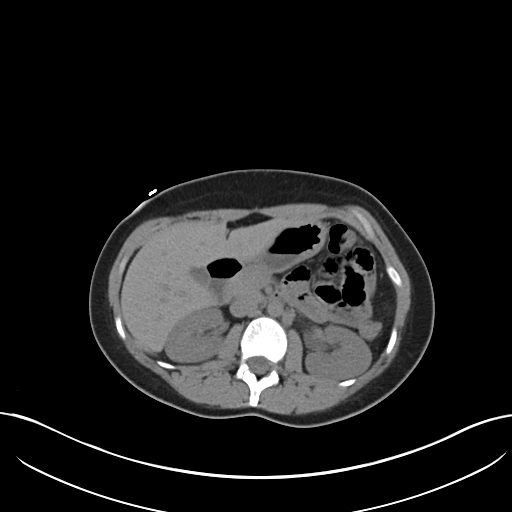
[im 63/80  soft-tissue]
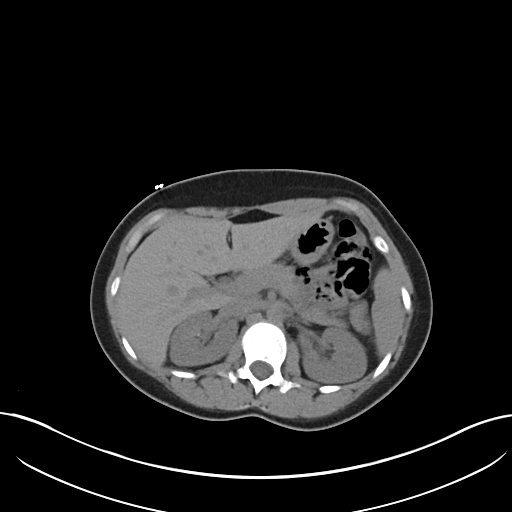
[im 70/80  soft-tissue]
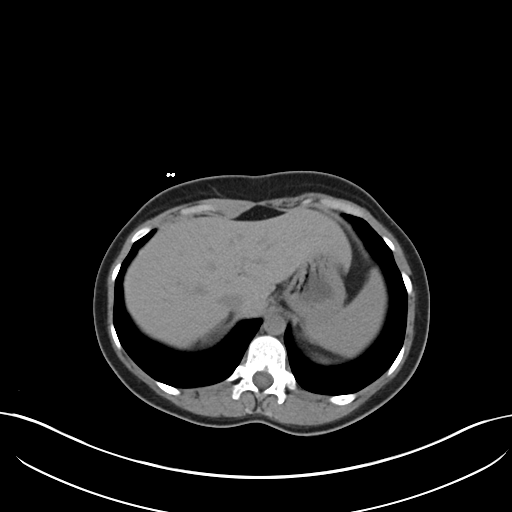
[im 76/80  soft-tissue]
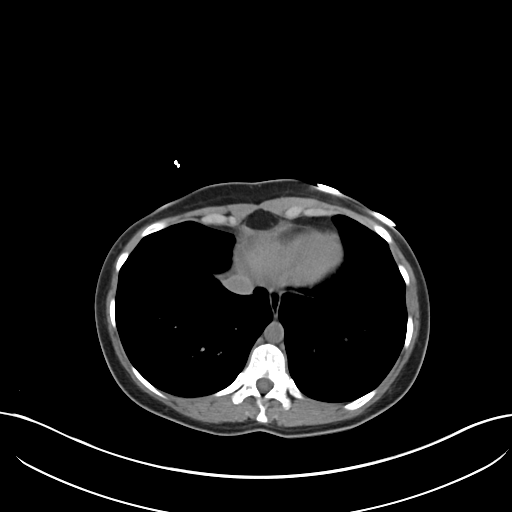

[Series 5: coronal soft tissue · coronal · 0.75mm/px · 3 of 61 slices shown]
[im 21/61  soft-tissue]
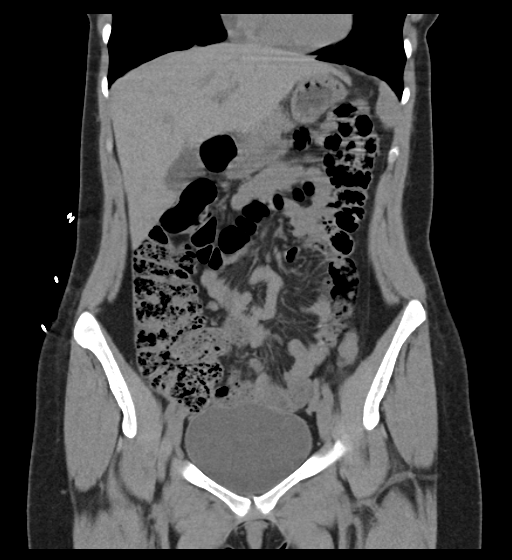
[im 27/61  soft-tissue]
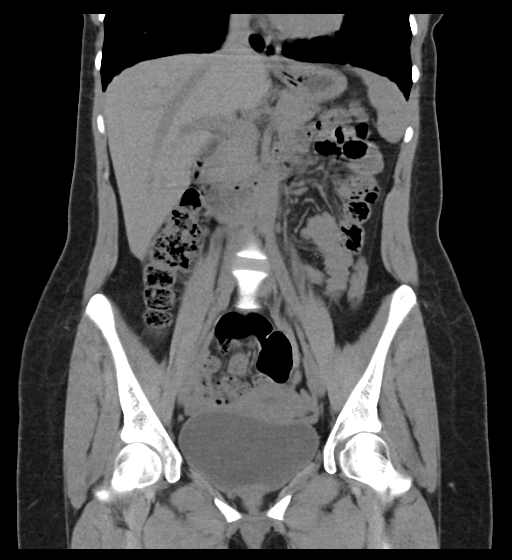
[im 34/61  soft-tissue]
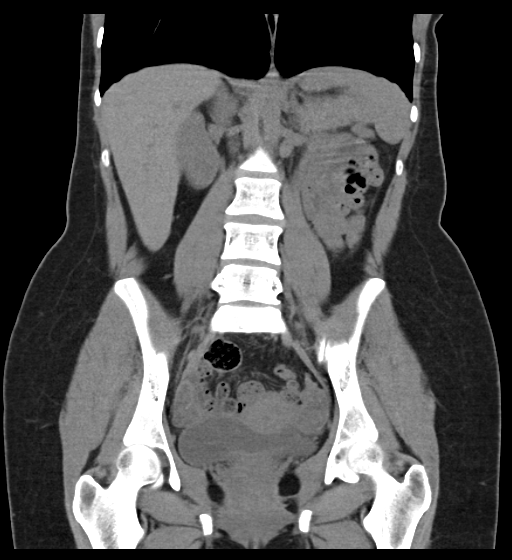

[17 of 46 positions shown; findings below may reference images not displayed]

FINDINGS: BODY WALL: Unremarkable.

LOWER CHEST: Unremarkable.

ABDOMEN/PELVIS:

Liver: No focal abnormality.

Biliary: No evidence of biliary obstruction or stone.

Pancreas: Unremarkable.

Spleen: Unremarkable.

Adrenals: Unremarkable.

Kidneys and ureters: 3 mm stone layering within the right urinary
bladder. Subtle haziness around the upper left ureter and clinical
history suggests recent passage from the left. There is bilateral
nephrolithiasis with 2 punctate calculi on the right and a 2 mm
stone in the interpolar left kidney.

Bladder: Stone as above.  No wall thickening.

Reproductive: Unremarkable.

Bowel: No obstruction.

Retroperitoneum: No mass or adenopathy.

Peritoneum: Small volume free pelvic fluid, usually physiologic.

Vascular: No acute abnormality.

OSSEOUS: Central disc herniation L5-S1, chronic and with buttressing
osteophyte.
IMPRESSION: 1. 3 mm bladder calculus, likely recently passed from the left.
2. Bilateral nephrolithiasis.

## 2023-12-18 ENCOUNTER — Encounter (HOSPITAL_BASED_OUTPATIENT_CLINIC_OR_DEPARTMENT_OTHER): Payer: Self-pay | Admitting: Emergency Medicine

## 2023-12-18 ENCOUNTER — Ambulatory Visit (HOSPITAL_BASED_OUTPATIENT_CLINIC_OR_DEPARTMENT_OTHER)
Admission: EM | Admit: 2023-12-18 | Discharge: 2023-12-18 | Disposition: A | Discharge status: Home / Self Care | Attending: Family Medicine | Admitting: Family Medicine

## 2023-12-18 DIAGNOSIS — J069 Acute upper respiratory infection, unspecified: Secondary | ICD-10-CM | POA: Diagnosis not present

## 2023-12-18 HISTORY — DX: Calculus of kidney: N20.0

## 2023-12-18 MED ORDER — BENZONATATE 100 MG PO CAPS: 100.0000 mg | ORAL_CAPSULE | Freq: Three times a day (TID) | ORAL | 0 refills | Status: AC

## 2023-12-18 NOTE — ED Triage Notes (Signed)
 Since last Monday had cough and congestion. Was taking OTC medications and inhaler. Reports feels a rattle in her chest.

## 2023-12-18 NOTE — ED Provider Notes (Signed)
 [facility]    CSN: [id number] Arrival date & time: 12/18/23  0904      History   Chief Complaint Chief Complaint  Patient presents with   Cough   Nasal Congestion    HPI Julie Stevenson is a 33 y.o. female.   33 year old female presents today with cough, chest tightness.  Started about 1 week ago.  Feels like somewhat improved but still having harsh cough.  Has been using over-the-counter medications with some relief.  Worried due to going on a trip in the next week and does not want to have infection.  Has been using her inhaler to help as needed.  Denies any fever, chills, body aches, night sweats   Cough   Past Medical History:  Diagnosis Date   Kidney stones     There are no active problems to display for this patient.   Past Surgical History:  Procedure Laterality Date   NECK SURGERY      OB History   No obstetric history on file.      Home Medications    Prior to Admission medications   Medication Sig Start Date End Date Taking? Authorizing Provider  benzonatate (TESSALON) 100 MG capsule Take 1 capsule (100 mg total) by mouth every 8 (eight) hours. 12/18/23  Yes Cammy Sanjurjo A, FNP  ibuprofen (ADVIL,MOTRIN) 200 MG tablet Take 200 mg by mouth every 6 (six) hours as needed for moderate pain.    [provider]  ibuprofen (ADVIL,MOTRIN) 600 MG tablet Take 1 tablet (600 mg total) by mouth every 6 (six) hours as needed. 08/11/14   Arby Barrette, MD  Multiple Vitamin (MULTIVITAMIN WITH MINERALS) TABS tablet Take 1 tablet by mouth daily.    [provider]  ondansetron (ZOFRAN ODT) 4 MG disintegrating tablet Take 1 tablet (4 mg total) by mouth every 4 (four) hours as needed for nausea or vomiting. 08/11/14   Arby Barrette, MD    Family History No family history on file.  Social History Social History   Tobacco Use   Smoking status: Never  Substance Use Topics   Alcohol use: No   Drug use: No     Allergies    Peanut-containing drug products   Review of Systems Review of Systems  Respiratory:  Positive for cough and chest tightness.      Physical Exam Triage Vital Signs ED Triage Vitals  Encounter Vitals Group     BP 12/18/23 0916 129/82     Systolic BP Percentile --      Diastolic BP Percentile --      Pulse Rate 12/18/23 0916 89     Resp 12/18/23 0916 17     Temp 12/18/23 0916 98.5 F (36.9 C)     Temp Source 12/18/23 0916 Oral     SpO2 12/18/23 0916 96 %     Weight --      Height --      Head Circumference --      Peak Flow --      Pain Score 12/18/23 0913 0     Pain Loc --      Pain Education --      Exclude from Growth Chart --    No data found.  Updated Vital Signs BP 129/82 (BP Location: Right Arm)   Pulse 89   Temp 98.5 F (36.9 C) (Oral)   Resp 17   LMP 12/04/2023   SpO2 96%   Visual Acuity Right Eye Distance:  Left Eye Distance:   Bilateral Distance:    Right Eye Near:   Left Eye Near:    Bilateral Near:     Physical Exam Constitutional:      General: She is not in acute distress.    Appearance: Normal appearance. She is not ill-appearing.  HENT:     Left Ear: Tympanic membrane normal.     Ears:     Comments: Mild fluid in TM, right    Nose: Nose normal.     Mouth/Throat:     Pharynx: Oropharynx is clear. No oropharyngeal exudate or posterior oropharyngeal erythema.  Cardiovascular:     Rate and Rhythm: Normal rate and regular rhythm.     Pulses: Normal pulses.     Heart sounds: Normal heart sounds.  Pulmonary:     Effort: Pulmonary effort is normal.     Breath sounds: Normal breath sounds.  Skin:    General: Skin is warm and dry.  Neurological:     Mental Status: She is alert.  Psychiatric:        Mood and Affect: Mood normal.      UC Treatments / Results  Labs (all labs ordered are listed, but only abnormal results are displayed) Labs Reviewed - No data to display  EKG   Radiology No results  found.  Procedures Procedures (including critical care time)  Medications Ordered in UC Medications - No data to display  Initial Impression / Assessment and Plan / UC Course  I have reviewed the triage vital signs and the nursing notes.  Pertinent labs & imaging results that were available during my care of the patient were reviewed by me and considered in my medical decision making (see chart for details).     Acute upper respiratory-lungs clear on exam.  No concern for pneumonia or bronchitis.  Recommend continue Mucinex and over-the-counter meds as needed.  Prescribed Tessalon Perles to use as needed.  Rest, hydrate. Follow-up for any worsening symptoms Final Clinical Impressions(s) / UC Diagnoses   Final diagnoses:  Acute upper respiratory infection     Discharge Instructions      I believe this is a viral upper respiratory infection.  There is no signs of bronchitis or pneumonia.  Recommend continue with Mucinex DM.  I will prescribe Tessalon Perles for cough as needed.  Please return for any worsening symptoms    ED Prescriptions     Medication Sig Dispense Auth. Provider   benzonatate (TESSALON) 100 MG capsule Take 1 capsule (100 mg total) by mouth every 8 (eight) hours. 21 capsule Janace Aris, FNP      PDMP not reviewed this encounter.   Janace Aris, FNP 12/18/23 [phone]

## 2023-12-18 NOTE — Discharge Instructions (Signed)
 I believe this is a viral upper respiratory infection.  There is no signs of bronchitis or pneumonia.  Recommend continue with Mucinex DM.  I will prescribe Tessalon Perles for cough as needed.  Please return for any worsening symptoms
